# Patient Record
Sex: Female | Born: 1965 | Race: Black or African American | Hispanic: No | Marital: Married | Smoking: Never smoker
Health system: Southern US, Community
[De-identification: ages and names within clinical notes are randomized; demographics above are authoritative.]

## PROBLEM LIST (undated history)

## (undated) DIAGNOSIS — E785 Hyperlipidemia, unspecified: Secondary | ICD-10-CM

## (undated) DIAGNOSIS — Z86718 Personal history of other venous thrombosis and embolism: Secondary | ICD-10-CM

## (undated) DIAGNOSIS — I10 Essential (primary) hypertension: Secondary | ICD-10-CM

## (undated) HISTORY — PX: BREAST SURGERY: SHX581

## (undated) HISTORY — PX: PARTIAL HYSTERECTOMY: SHX80

---

## 2006-12-14 ENCOUNTER — Inpatient Hospital Stay (HOSPITAL_COMMUNITY): Admission: AD | Admit: 2006-12-14 | Discharge: 2006-12-14 | Payer: Self-pay | Admitting: Family Medicine

## 2007-04-02 ENCOUNTER — Other Ambulatory Visit: Admission: RE | Admit: 2007-04-02 | Discharge: 2007-04-02 | Payer: Self-pay | Admitting: Internal Medicine

## 2008-04-17 IMAGING — US US TRANSVAGINAL NON-OB
1 series · 14 of 25 positions shown · non-contrast
Comparison: none

CLINICAL DATA: 41-year-old female with heavy vaginal bleeding.  
 TRANSABDOMINAL AND TRANSVAGINAL PELVIC ULTRASOUND:
TECHNIQUE: Both transabdominal and transvaginal ultrasound examinations of the pelvis were performed including evaluation of the uterus, ovaries, adnexal regions, and pelvic cul-de-sac.

[Series 1: us transvaginal non-ob · 14 of 35 slices shown]
[im 1/35]
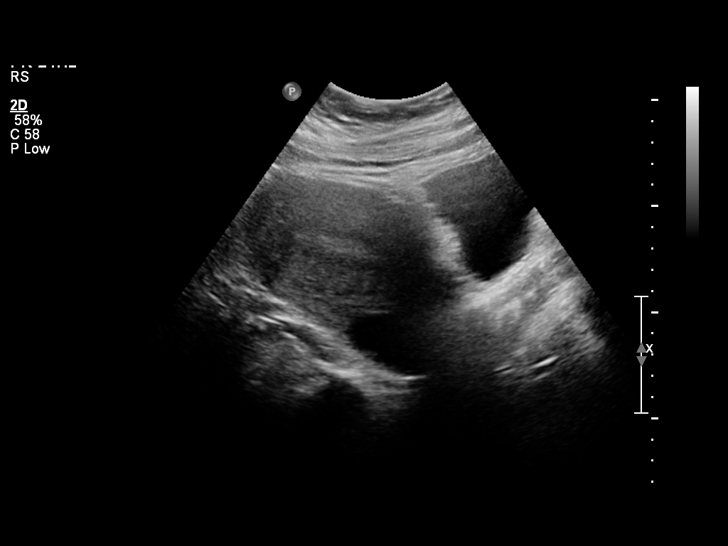
[im 3/35]
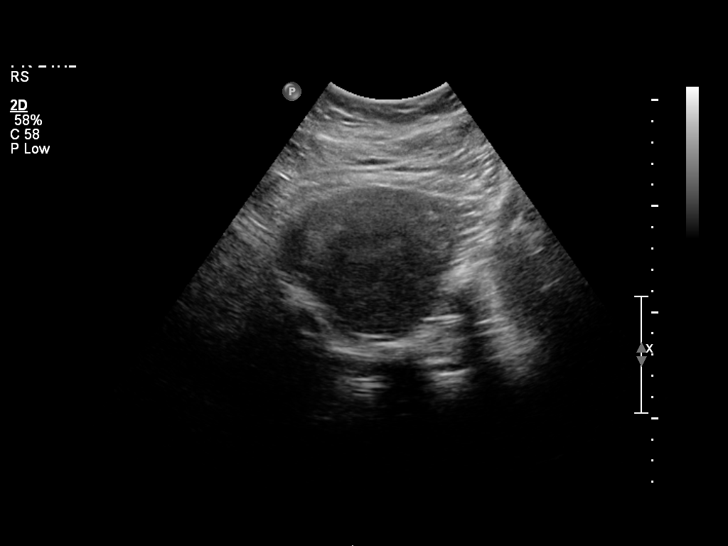
[im 6/35]
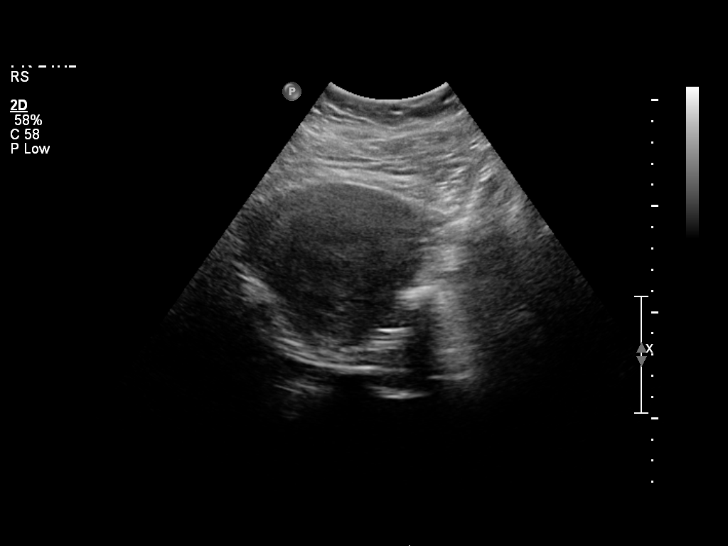
[im 9/35]
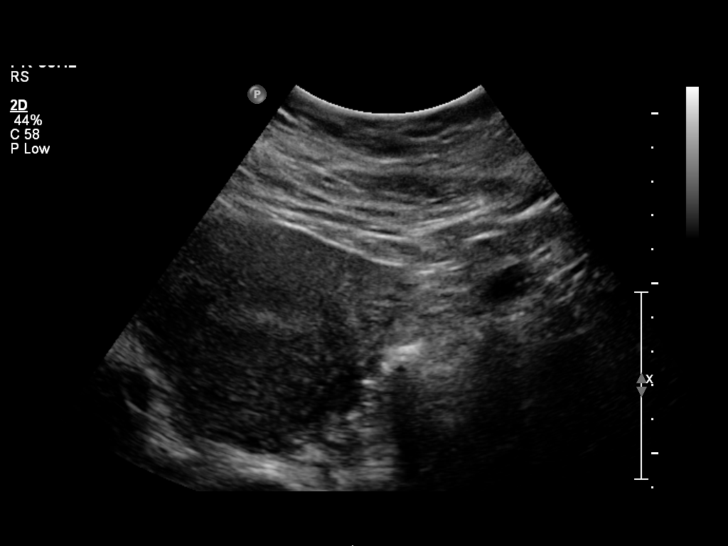
[im 12/35]
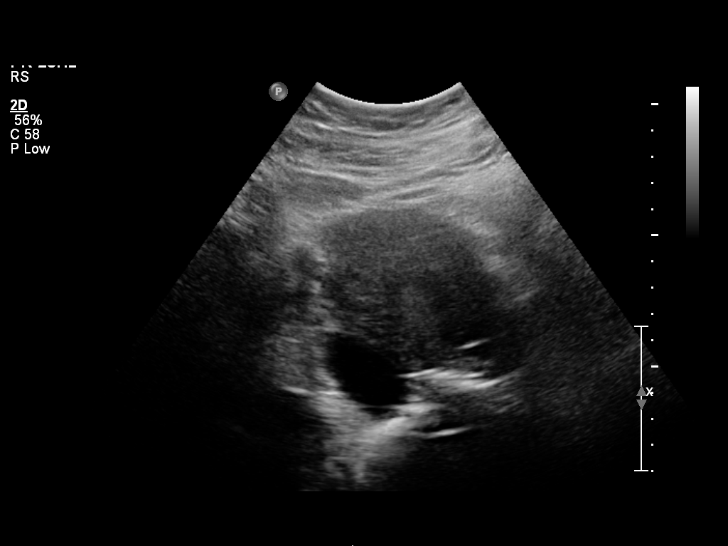
[im 13/35]
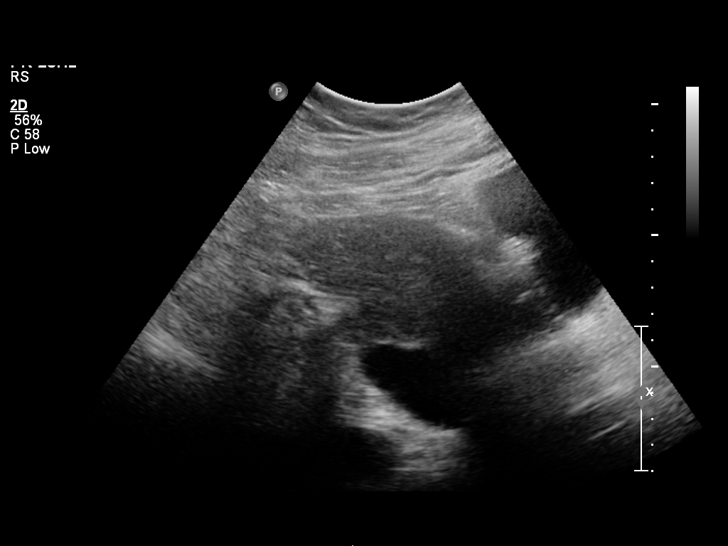
[im 16/35]
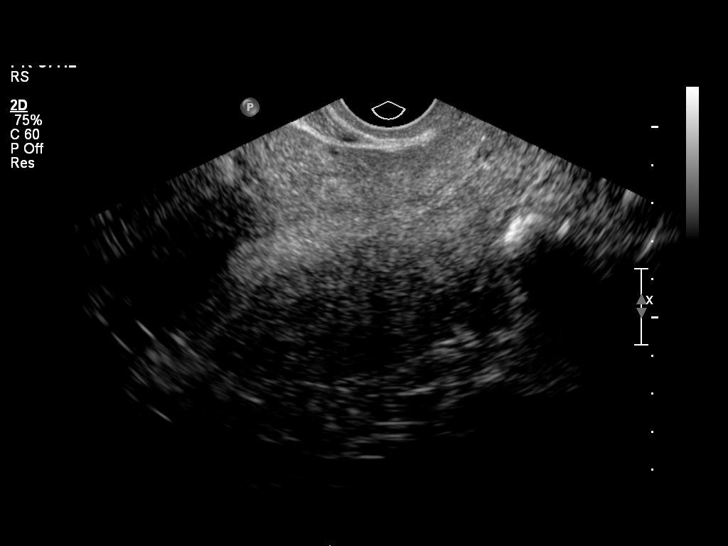
[im 19/35]
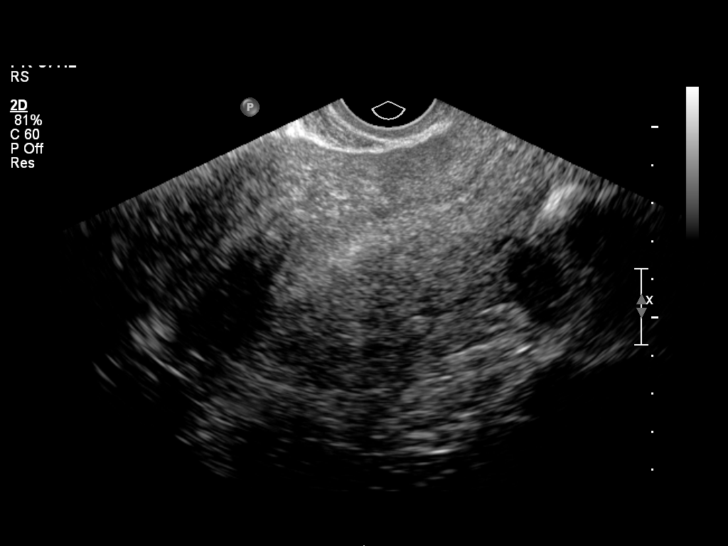
[im 22/35]
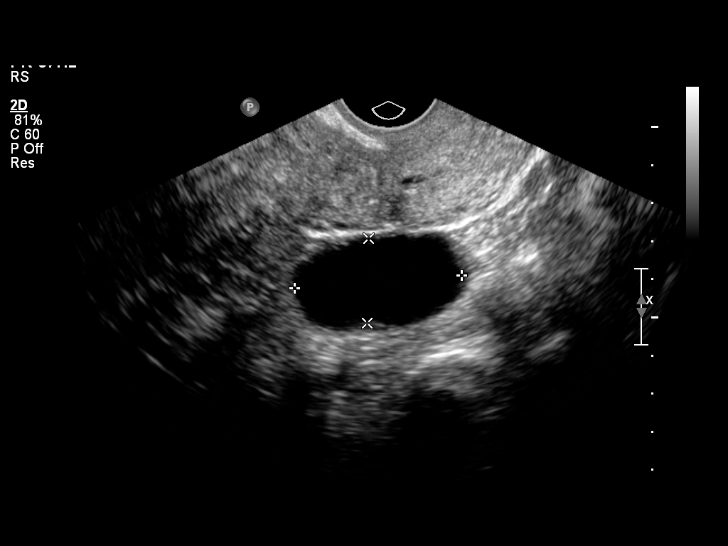
[im 23/35]
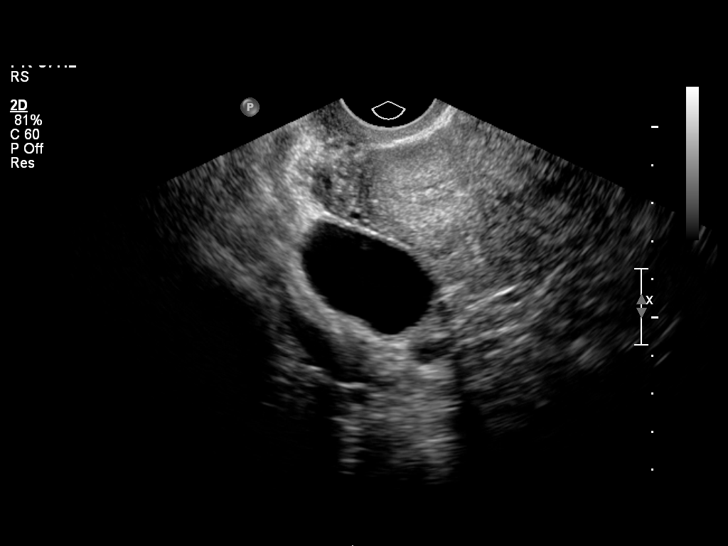
[im 26/35]
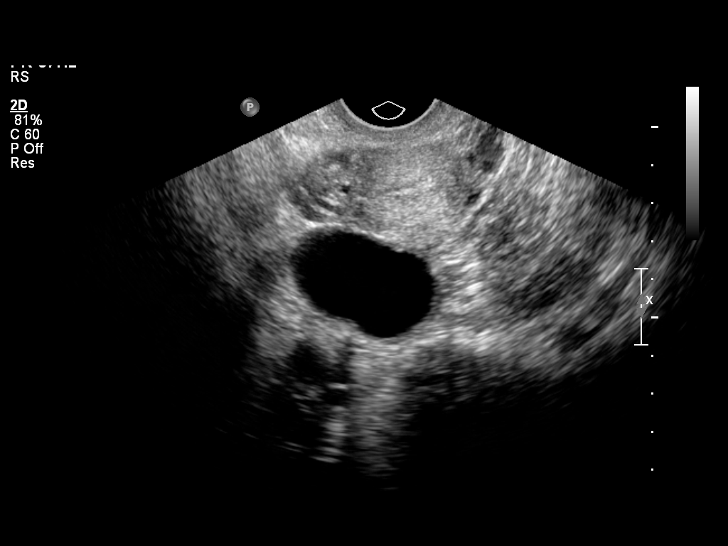
[im 29/35]
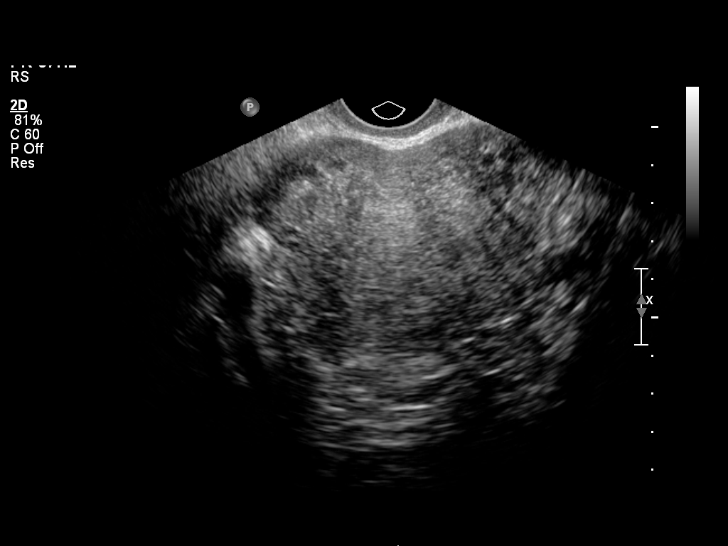
[im 32/35]
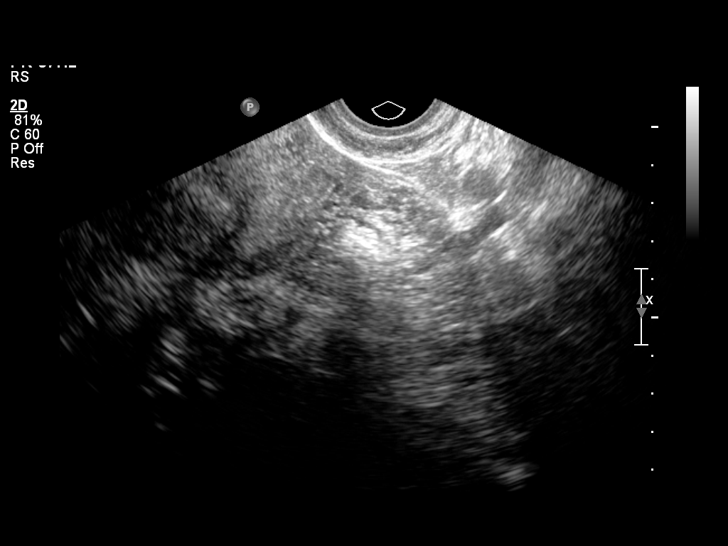
[im 35/35]
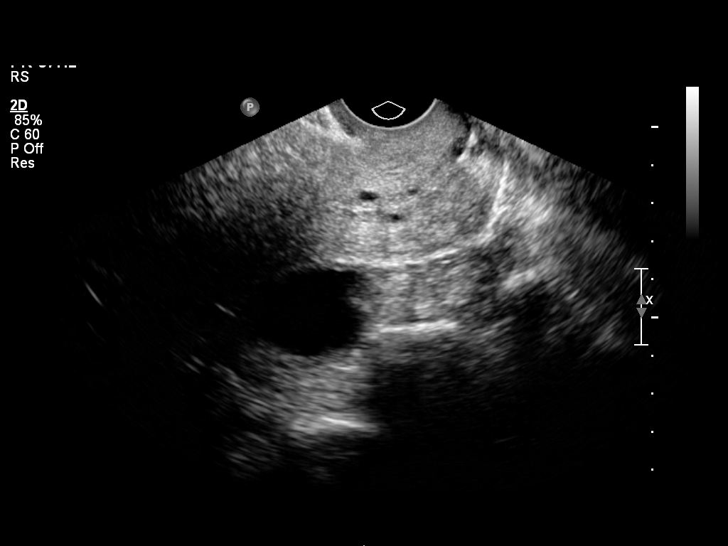

[14 of 25 positions shown; findings below may reference images not displayed]

FINDINGS: The uterus is unremarkable measuring 13 x 6 x 8 cm.  The endometrial stripe is homogeneous measuring 10 mm in greatest diameter.  No focal uterine lesions are identified.  4.4 x 2.2 x 2.9 cm simple appearing cyst within the right ovary is noted.  Remainder of the right ovary is unremarkable.   Left ovary is normal in size and appearance.  No free fluid or solid adnexal mass is identified.
IMPRESSION: 1.  4.4 x 2.2 x 2.9 cm simple right ovarian cyst ? consider ultrasound follow-up in 6-8 weeks.
 2.  No other significant abnormality.

## 2008-12-26 ENCOUNTER — Emergency Department (HOSPITAL_COMMUNITY): Admission: EM | Admit: 2008-12-26 | Discharge: 2008-12-26 | Payer: Self-pay | Admitting: Emergency Medicine

## 2010-03-19 ENCOUNTER — Emergency Department (HOSPITAL_COMMUNITY): Admission: EM | Admit: 2010-03-19 | Discharge: 2010-03-19 | Payer: Self-pay | Admitting: Emergency Medicine

## 2010-04-30 IMAGING — CR DG CHEST 2V
2 series · 2 of 2 positions shown · non-contrast
Comparison: None

CLINICAL DATA: Fever, chills, body aches, productive cough,
hypertension

CHEST - 2 VIEW

[w chest pa]
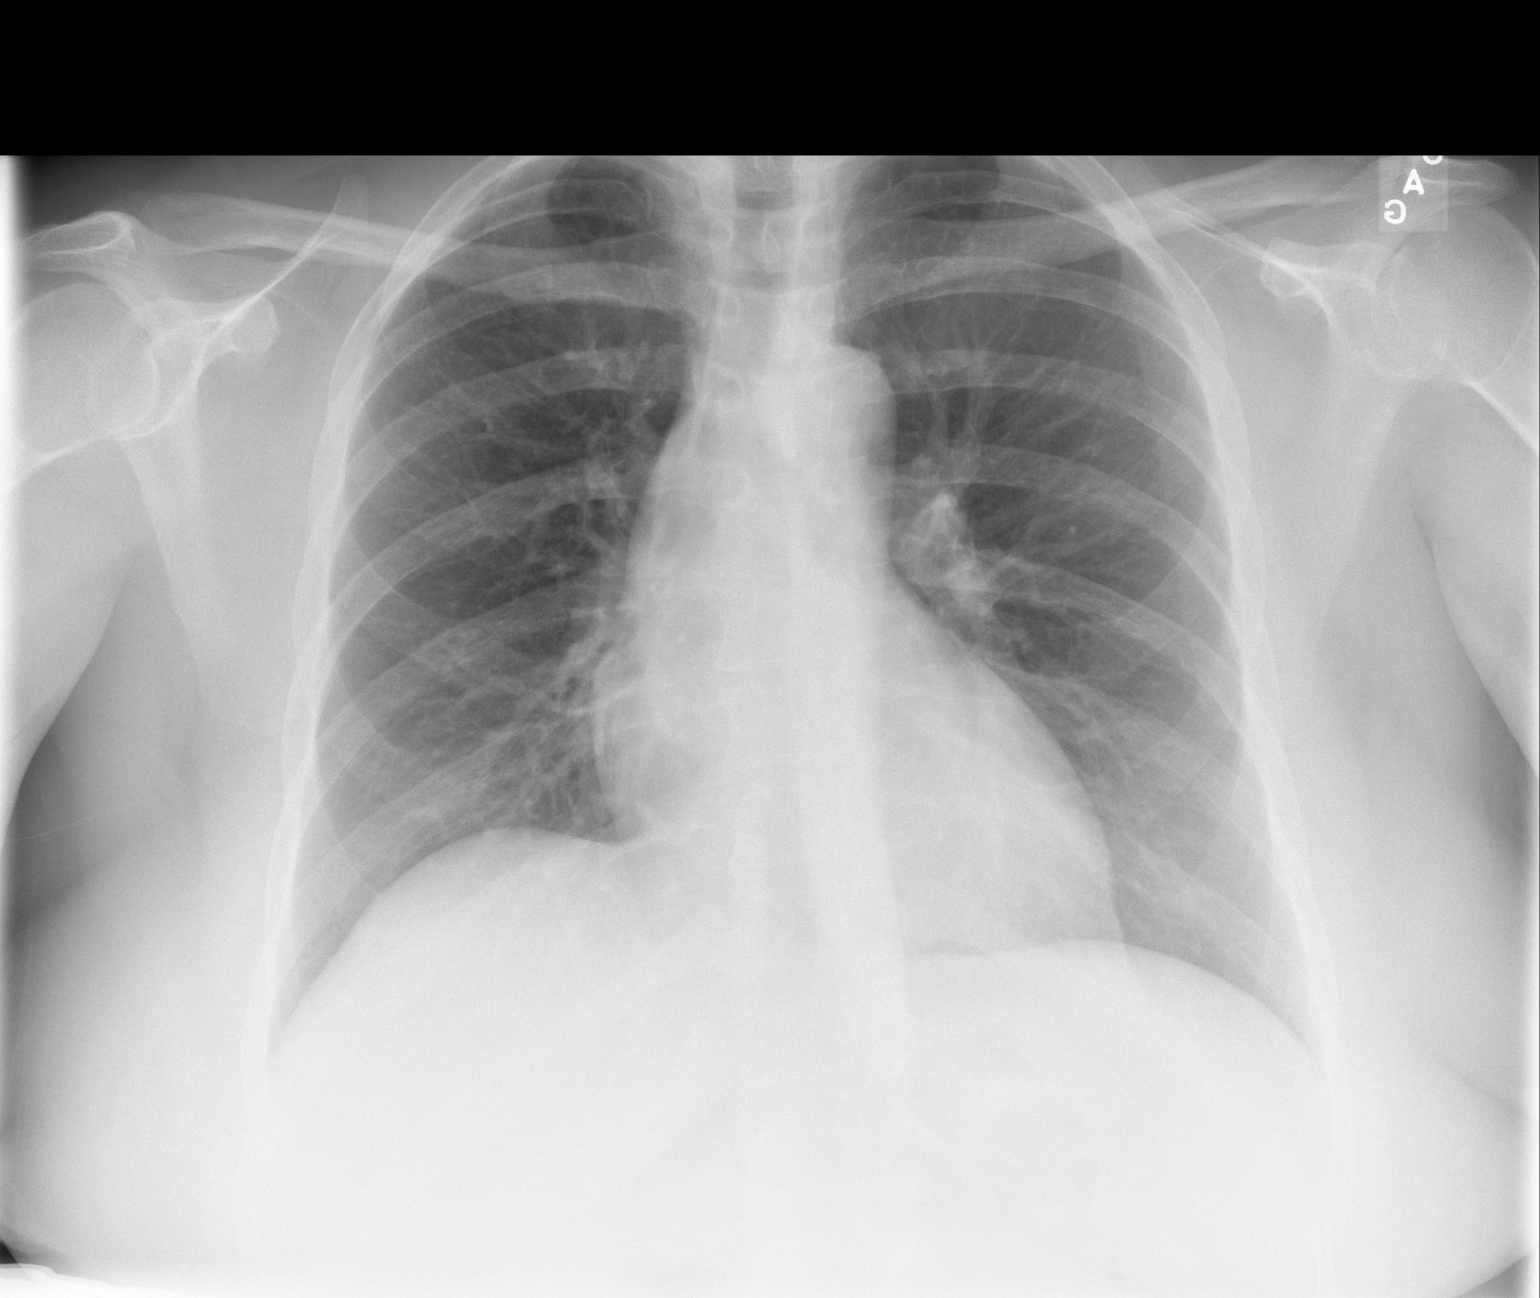

[w chest lat]
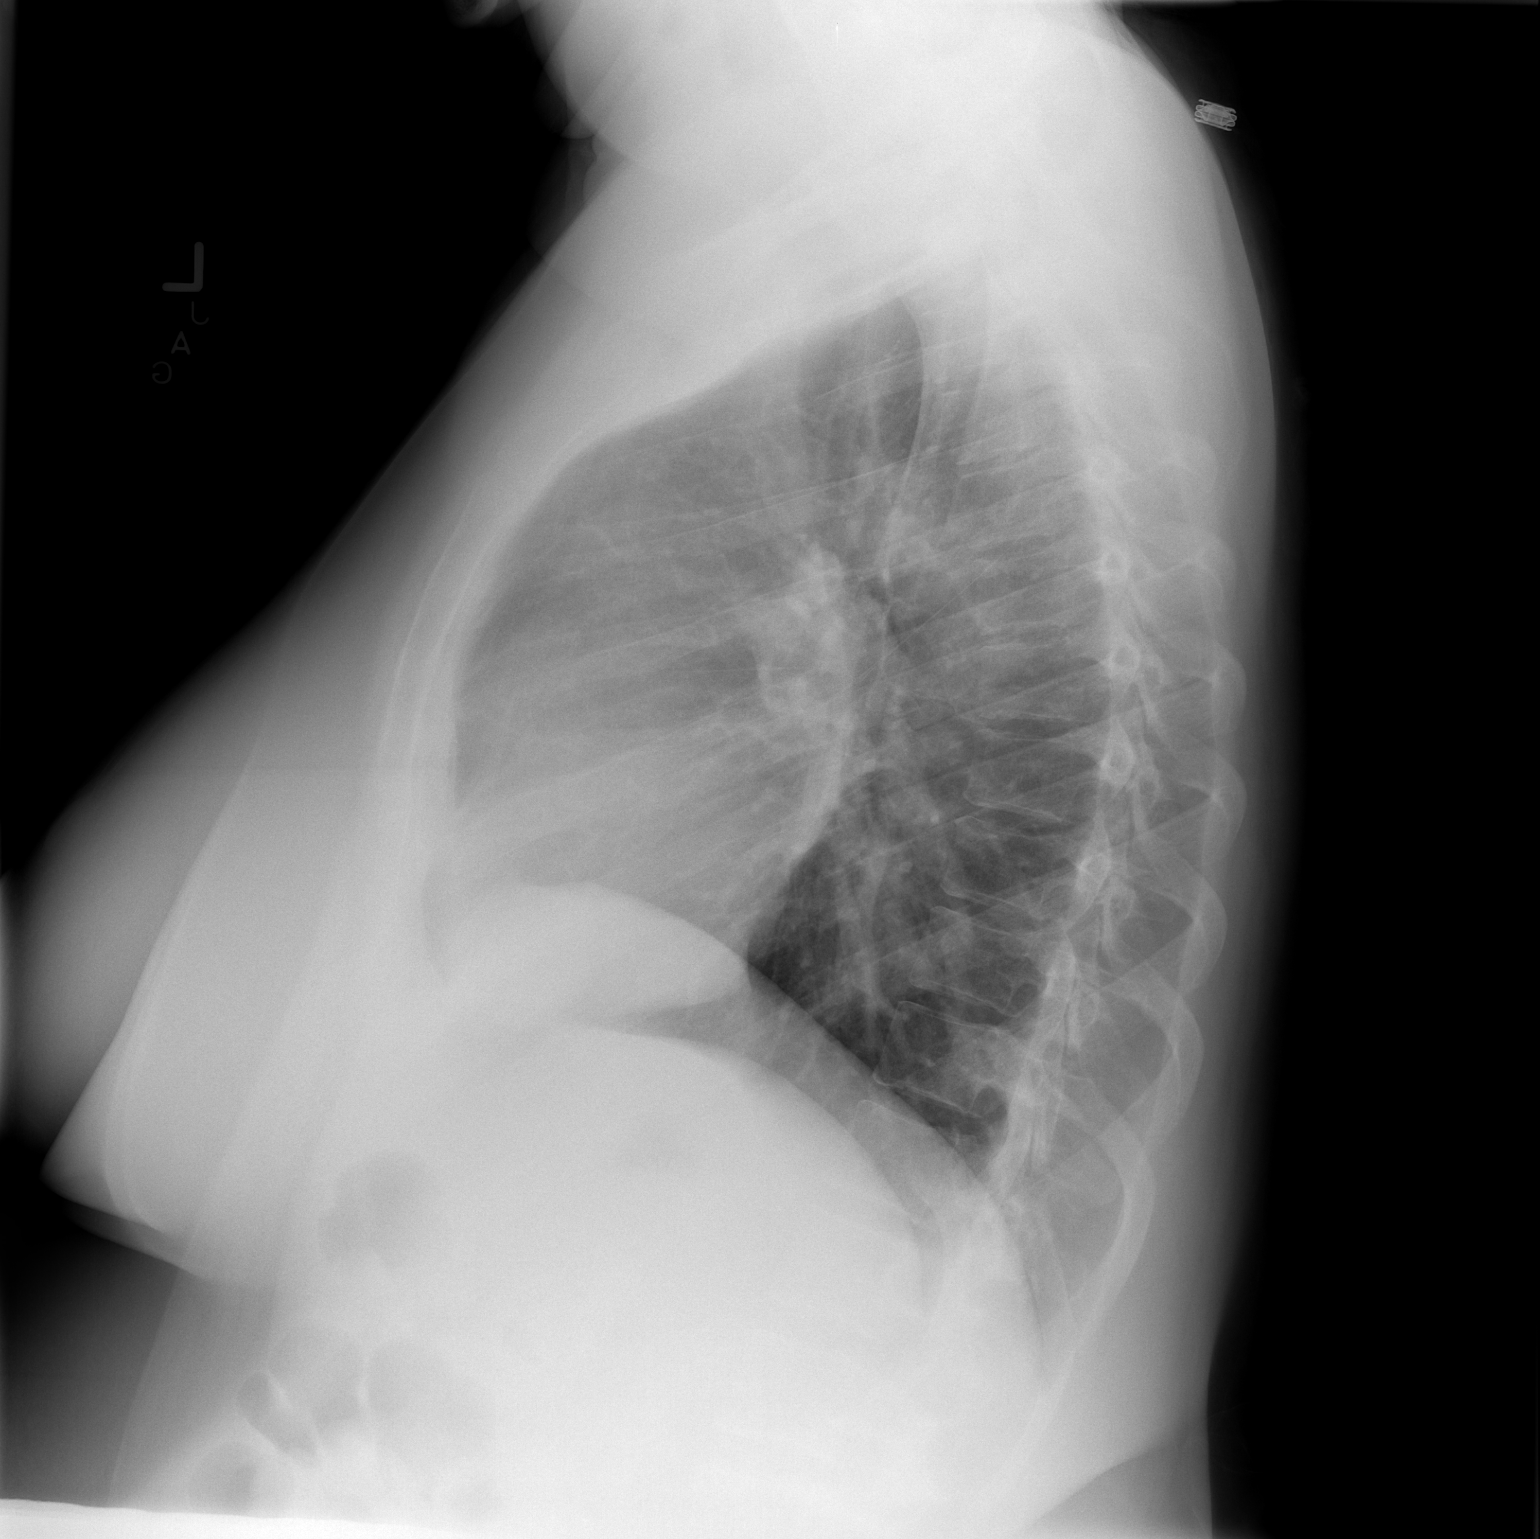

[2 of 2 positions shown; findings below may reference images not displayed]

FINDINGS: Normal heart size, mediastinal contours, and pulmonary vascularity.
Lungs clear.
Bones unremarkable.
No pneumothorax.
IMPRESSION: No acute abnormalities.

## 2010-10-19 LAB — POCT I-STAT, CHEM 8
BUN: 14 mg/dL (ref 6–23)
Calcium, Ion: 1.16 mmol/L (ref 1.12–1.32)
Chloride: 109 mEq/L (ref 96–112)
Creatinine, Ser: 1.3 mg/dL — ABNORMAL HIGH (ref 0.4–1.2)
Glucose, Bld: 99 mg/dL (ref 70–99)
TCO2: 25 mmol/L (ref 0–100)

## 2011-07-27 ENCOUNTER — Ambulatory Visit: Payer: BC Managed Care – PPO

## 2011-07-27 DIAGNOSIS — G43009 Migraine without aura, not intractable, without status migrainosus: Secondary | ICD-10-CM

## 2011-07-27 DIAGNOSIS — I10 Essential (primary) hypertension: Secondary | ICD-10-CM

## 2013-10-21 ENCOUNTER — Institutional Professional Consult (permissible substitution): Payer: Self-pay | Admitting: Internal Medicine

## 2020-04-05 ENCOUNTER — Other Ambulatory Visit: Payer: BLUE CROSS/BLUE SHIELD

## 2020-04-05 ENCOUNTER — Other Ambulatory Visit: Payer: Self-pay

## 2020-04-05 DIAGNOSIS — Z20822 Contact with and (suspected) exposure to covid-19: Secondary | ICD-10-CM

## 2020-04-07 LAB — NOVEL CORONAVIRUS, NAA: SARS-CoV-2, NAA: NOT DETECTED

## 2020-07-05 ENCOUNTER — Ambulatory Visit (HOSPITAL_COMMUNITY)
Admission: EM | Admit: 2020-07-05 | Discharge: 2020-07-05 | Disposition: A | Discharge status: Home / Self Care | Payer: BLUE CROSS/BLUE SHIELD | Attending: Family Medicine | Admitting: Family Medicine

## 2020-07-05 ENCOUNTER — Encounter (HOSPITAL_COMMUNITY): Payer: Self-pay | Admitting: Emergency Medicine

## 2020-07-05 ENCOUNTER — Other Ambulatory Visit: Payer: Self-pay

## 2020-07-05 DIAGNOSIS — Z20822 Contact with and (suspected) exposure to covid-19: Secondary | ICD-10-CM | POA: Insufficient documentation

## 2020-07-05 DIAGNOSIS — K529 Noninfective gastroenteritis and colitis, unspecified: Secondary | ICD-10-CM | POA: Diagnosis not present

## 2020-07-05 HISTORY — DX: Personal history of other venous thrombosis and embolism: Z86.718

## 2020-07-05 HISTORY — DX: Hyperlipidemia, unspecified: E78.5

## 2020-07-05 HISTORY — DX: Essential (primary) hypertension: I10

## 2020-07-05 LAB — SARS CORONAVIRUS 2 (TAT 6-24 HRS): SARS Coronavirus 2: NEGATIVE

## 2020-07-05 MED ORDER — ONDANSETRON 4 MG PO TBDP
4.0000 mg | ORAL_TABLET | Freq: Once | ORAL | Status: AC
  Administered 2020-07-05: 4 mg via ORAL

## 2020-07-05 MED ORDER — ONDANSETRON 4 MG PO TBDP
ORAL_TABLET | ORAL | Status: AC
  Filled 2020-07-05: qty 1

## 2020-07-05 MED ORDER — ONDANSETRON 4 MG PO TBDP: 4.0000 mg | ORAL_TABLET | Freq: Three times a day (TID) | ORAL | 0 refills | Status: DC | PRN

## 2020-07-05 NOTE — ED Triage Notes (Signed)
Pt presents with nausea, vomiting, and diarrhea, headache, chills xs 3-4 days. States coworker has same symptoms. States has not vomited since yesterday afternoon.

## 2020-07-05 NOTE — ED Provider Notes (Signed)
[facility]    CSN: [id number] Arrival date & time: 07/05/20  1336      History   Chief Complaint Chief Complaint  Patient presents with  . Emesis  . Nausea  . Diarrhea  . Chills    HPI Lorenzo Pereyra is a 54 y.o. female.   Lucille Passy presents with complaints of nausea, vomiting, diarrhea, chills, occasional cough. Started two days ago. Feels improved today but hasn't eaten today. Small sips of fluids this afternoon only. No vomiting or diarrhea today. Last was yesterday. No abdominal pain. No fevers. No dizziness. A coworker she rides with has had similar illness. She has had two covid vaccines. Normal urination.    ROS per HPI, negative if not otherwise mentioned.      Past Medical History:  Diagnosis Date  . Hx of blood clots   . Hyperlipemia   . Hypertension     There are no problems to display for this patient.   Past Surgical History:  Procedure Laterality Date  . BREAST SURGERY    . PARTIAL HYSTERECTOMY      OB History   No obstetric history on file.      Home Medications    Prior to Admission medications   Medication Sig Start Date End Date Taking? Authorizing Provider  amLODipine (NORVASC) 5 MG tablet Take 5 mg by mouth daily. 05/21/20   [provider]  atorvastatin (LIPITOR) 20 MG tablet Take 20 mg by mouth daily. 04/25/20   [provider]  Cholecalciferol 125 MCG (5000 UT) capsule Take by mouth.    [provider]  losartan (COZAAR) 100 MG tablet Take 100 mg by mouth daily. 05/07/20   [provider]  ondansetron (ZOFRAN-ODT) 4 MG disintegrating tablet Take 1 tablet (4 mg total) by mouth every 8 (eight) hours as needed for nausea or vomiting. 07/05/20   Serenitee Fuertes, Barron Alvine, NP  XARELTO 20 MG TABS tablet Take 20 mg by mouth daily. 07/05/20   [provider]    Family History Family History  Problem Relation Age of Onset  . Hypertension Mother   . Diabetes Mother   . Heart failure  Mother   . Diabetes Father   . Hypertension Father     Social History Social History   Tobacco Use  . Smoking status: Never Smoker  . Smokeless tobacco: Never Used  Substance Use Topics  . Alcohol use: Not Currently  . Drug use: Not on file     Allergies   Meloxicam, Clonidine derivatives, Lisinopril, and Metoprolol   Review of Systems Review of Systems   Physical Exam Triage Vital Signs ED Triage Vitals  Enc Vitals Group     BP 07/05/20 1512 132/79     Pulse Rate 07/05/20 1512 61     Resp 07/05/20 1512 18     Temp 07/05/20 1512 97.9 F (36.6 C)     Temp Source 07/05/20 1512 Oral     SpO2 07/05/20 1512 100 %     Weight --      Height --      Head Circumference --      Peak Flow --      Pain Score 07/05/20 1508 9     Pain Loc --      Pain Edu? --      Excl. in GC? --    No data found.  Updated Vital Signs BP 132/79 (BP Location: Right Arm)   Pulse 61   Temp  97.9 F (36.6 C) (Oral)   Resp 18   SpO2 100%   Visual Acuity Right Eye Distance:   Left Eye Distance:   Bilateral Distance:    Right Eye Near:   Left Eye Near:    Bilateral Near:     Physical Exam Constitutional:      General: She is not in acute distress.    Appearance: She is well-developed.  Cardiovascular:     Rate and Rhythm: Normal rate.  Pulmonary:     Effort: Pulmonary effort is normal.  Abdominal:     Tenderness: There is no abdominal tenderness.  Skin:    General: Skin is warm and dry.  Neurological:     Mental Status: She is alert and oriented to person, place, and time.      UC Treatments / Results  Labs (all labs ordered are listed, but only abnormal results are displayed) Labs Reviewed  SARS CORONAVIRUS 2 (TAT 6-24 HRS)    EKG   Radiology No results found.  Procedures Procedures (including critical care time)  Medications Ordered in UC Medications  ondansetron (ZOFRAN-ODT) disintegrating tablet 4 mg (has no administration in time range)    Initial  Impression / Assessment and Plan / UC Course  I have reviewed the triage vital signs and the nursing notes.  Pertinent labs & imaging results that were available during my care of the patient were reviewed by me and considered in my medical decision making (see chart for details).     Non toxic. No abdominal pain. No fevers. Taking some sips of fluids while here in clinic. History and physical consistent with viral illness, likely from coworker. Covid testing pending and isolation instructions provided.  Supportive cares recommended. Return precautions provided. Patient verbalized understanding and agreeable to plan.   Final Clinical Impressions(s) / UC Diagnoses   Final diagnoses:  Gastroenteritis     Discharge Instructions     Zofran every 8 hours as needed for nausea or vomiting.  Small frequent sips of fluids- Pedialyte, Gatorade, water, broth- to maintain hydration.   Isolate until your covid testing is back, should result in the next 24 hours. We will call you if positive, your results will post to your MyChart.  If symptoms worsen or do not improve in the next week to return to be seen or to follow up with your PCP.      ED Prescriptions    Medication Sig Dispense Auth. Provider   ondansetron (ZOFRAN-ODT) 4 MG disintegrating tablet Take 1 tablet (4 mg total) by mouth every 8 (eight) hours as needed for nausea or vomiting. 12 tablet Georgetta Haber, NP     PDMP not reviewed this encounter.   Georgetta Haber, NP 07/05/20 1550

## 2020-07-05 NOTE — Discharge Instructions (Addendum)
Zofran every 8 hours as needed for nausea or vomiting.  Small frequent sips of fluids- Pedialyte, Gatorade, water, broth- to maintain hydration.   Isolate until your covid testing is back, should result in the next 24 hours. We will call you if positive, your results will post to your MyChart.  If symptoms worsen or do not improve in the next week to return to be seen or to follow up with your PCP.

## 2022-02-20 ENCOUNTER — Ambulatory Visit (HOSPITAL_COMMUNITY)
Admission: EM | Admit: 2022-02-20 | Discharge: 2022-02-20 | Disposition: A | Discharge status: Home / Self Care | Payer: BC Managed Care – PPO | Attending: Family Medicine | Admitting: Family Medicine

## 2022-02-20 ENCOUNTER — Encounter (HOSPITAL_COMMUNITY): Payer: Self-pay | Admitting: Emergency Medicine

## 2022-02-20 ENCOUNTER — Ambulatory Visit (INDEPENDENT_AMBULATORY_CARE_PROVIDER_SITE_OTHER): Payer: BC Managed Care – PPO

## 2022-02-20 DIAGNOSIS — N3090 Cystitis, unspecified without hematuria: Secondary | ICD-10-CM | POA: Diagnosis present

## 2022-02-20 DIAGNOSIS — R053 Chronic cough: Secondary | ICD-10-CM | POA: Diagnosis not present

## 2022-02-20 DIAGNOSIS — R059 Cough, unspecified: Secondary | ICD-10-CM | POA: Diagnosis not present

## 2022-02-20 LAB — POCT URINALYSIS DIPSTICK, ED / UC
Bilirubin Urine: NEGATIVE
Glucose, UA: NEGATIVE mg/dL
Ketones, ur: NEGATIVE mg/dL
Leukocytes,Ua: NEGATIVE
Nitrite: NEGATIVE
Protein, ur: NEGATIVE mg/dL
Specific Gravity, Urine: 1.015 (ref 1.005–1.030)
Urobilinogen, UA: 0.2 mg/dL (ref 0.0–1.0)
pH: 6.5 (ref 5.0–8.0)

## 2022-02-20 MED ORDER — CEPHALEXIN 500 MG PO CAPS: 500.0000 mg | ORAL_CAPSULE | Freq: Two times a day (BID) | ORAL | 0 refills | Status: DC

## 2022-02-20 NOTE — ED Triage Notes (Signed)
Patient c/o cloudy and odorus urine x 2 weeks, no hematuria.  Patient denies any STI due to not being sexually active.  Patient denies any OTC meds.  Patient is also having a cough.

## 2022-02-20 NOTE — Discharge Instructions (Addendum)
You have had labs (urine culture) sent today. We will call you with any significant abnormalities or if there is need to begin or change treatment or pursue further follow up.  You may also review your test results online through MyChart. If you do not have a MyChart account, instructions to sign up should be on your discharge paperwork.  

## 2022-02-21 NOTE — ED Provider Notes (Signed)
[facility]    ASSESSMENT & PLAN:  1. Cystitis   2. Persistent cough for 3 weeks or longer    I have personally viewed the imaging studies ordered this visit. No acute changes on CXR. Cough likely post-viral. Prefers OTC tx as needed.  Begin: Meds ordered this encounter  Medications   cephALEXin (KEFLEX) 500 MG capsule    Sig: Take 1 capsule (500 mg total) by mouth 2 (two) times daily.    Dispense:  10 capsule    Refill:  0   No signs of pyelonephritis. Discussed. Urine culture sent. Will notify patient of any significant results. Will follow up with her PCP or here if not showing improvement over the next 48 hours, sooner if needed.  Outlined signs and symptoms indicating need for more acute intervention. Patient verbalized understanding. After Visit Summary given.  SUBJECTIVE:  Beth Houston is a 56 y.o. female who complains of urinary frequency, urgency and dysuria for the past 2 w. Without associated flank pain, fever, chills, vaginal discharge or bleeding. Gross hematuria: not present. No specific aggravating or alleviating factors reported. No LE edema. Normal PO intake without n/v/d. Without specific abdominal pain. Ambulatory without difficulty. No tx PTA.  LMP: No LMP recorded. Patient has had a hysterectomy.  Also reports dry cough; x 3 weeks after URI. No SOB/wheezing.  OBJECTIVE:  Vitals:   02/20/22 1534 02/20/22 1536  BP: 115/75   Pulse: 71   Resp: 18   Temp: 98.3 F (36.8 C)   TempSrc: Oral   SpO2: 95%   Weight:  136.1 kg  Height:  5' 4.5" (1.638 m)   General appearance: alert; no distress HENT: oropharynx: moist Lungs: unlabored respirations; CTAB Abdomen: soft, non-tender; bowel sounds normal; no masses or organomegaly; no guarding or rebound tenderness Back: no CVA tenderness Extremities: no edema; symmetrical with no gross deformities Skin: warm and dry Neurologic: normal gait Psychological: alert and cooperative; normal  mood and affect  Labs Reviewed  POCT URINALYSIS DIPSTICK, ED / UC - Abnormal; Notable for the following components:      Result Value   Hgb urine dipstick TRACE (*)    All other components within normal limits  URINE CULTURE    Allergies  Allergen Reactions   Meloxicam Palpitations    Ibuprofen is fine   Clonidine Derivatives Other (See Comments)    "passing out"   Lisinopril Other (See Comments)    Lightheadedness, fatigue   Metoprolol Other (See Comments)    "slows heart rate down"    Past Medical History:  Diagnosis Date   Hx of blood clots    Hyperlipemia    Hypertension    Social History   Socioeconomic History   Marital status: Married    Spouse name: Not on file   Number of children: Not on file   Years of education: Not on file   Highest education level: Not on file  Occupational History   Not on file  Tobacco Use   Smoking status: Never   Smokeless tobacco: Never  Substance and Sexual Activity   Alcohol use: Never   Drug use: Never   Sexual activity: Not Currently  Other Topics Concern   Not on file  Social History Narrative   Not on file   Social Determinants of Health   Financial Resource Strain: Not on file  Food Insecurity: Not on file  Transportation Needs: Not on file  Physical Activity: Not on file  Stress: Not on file  Social Connections: Not on file  Intimate Partner Violence: Not on file   Family History  Problem Relation Age of Onset   Hypertension Mother    Diabetes Mother    Heart failure Mother    Diabetes Father    Hypertension Father         Mardella Layman, MD 02/21/22 1331

## 2022-02-22 LAB — URINE CULTURE: Culture: >=100000 — AB

## 2022-06-17 ENCOUNTER — Ambulatory Visit (HOSPITAL_COMMUNITY)
Admission: EM | Admit: 2022-06-17 | Discharge: 2022-06-17 | Disposition: A | Discharge status: Home / Self Care | Payer: BC Managed Care – PPO | Attending: Emergency Medicine | Admitting: Emergency Medicine

## 2022-06-17 ENCOUNTER — Encounter (HOSPITAL_COMMUNITY): Payer: Self-pay

## 2022-06-17 DIAGNOSIS — R0600 Dyspnea, unspecified: Secondary | ICD-10-CM

## 2022-06-17 DIAGNOSIS — Z889 Allergy status to unspecified drugs, medicaments and biological substances status: Secondary | ICD-10-CM

## 2022-06-17 DIAGNOSIS — R051 Acute cough: Secondary | ICD-10-CM

## 2022-06-17 MED ORDER — ALBUTEROL SULFATE (2.5 MG/3ML) 0.083% IN NEBU
INHALATION_SOLUTION | RESPIRATORY_TRACT | Status: AC
  Filled 2022-06-17: qty 3

## 2022-06-17 MED ORDER — LEVOCETIRIZINE DIHYDROCHLORIDE 5 MG PO TABS
5.0000 mg | ORAL_TABLET | Freq: Every evening | ORAL | 0 refills | Status: DC
Start: 2022-06-17 — End: 2023-06-13

## 2022-06-17 MED ORDER — ALBUTEROL SULFATE HFA 108 (90 BASE) MCG/ACT IN AERS: 2.0000 | INHALATION_SPRAY | Freq: Four times a day (QID) | RESPIRATORY_TRACT | 0 refills | Status: AC | PRN

## 2022-06-17 MED ORDER — PROMETHAZINE-DM 6.25-15 MG/5ML PO SYRP: 5.0000 mL | ORAL_SOLUTION | Freq: Every evening | ORAL | 0 refills | Status: DC

## 2022-06-17 MED ORDER — ALBUTEROL SULFATE (2.5 MG/3ML) 0.083% IN NEBU
2.5000 mg | INHALATION_SOLUTION | Freq: Once | RESPIRATORY_TRACT | Status: AC
  Administered 2022-06-17: 2.5 mg via RESPIRATORY_TRACT

## 2022-06-17 MED ORDER — PREDNISONE 20 MG PO TABS: 40.0000 mg | ORAL_TABLET | Freq: Every day | ORAL | 0 refills | Status: DC

## 2022-06-17 NOTE — ED Triage Notes (Signed)
Pt is here for a cough, runny nose, , pt states she has been coughing to the point it hurts her stomach and throat x2 days

## 2022-06-17 NOTE — Discharge Instructions (Addendum)
Prednisone has been sent to the pharmacy, you will take 2 tablets each morning for the next 5 days.   Levocetirizine has been sent to the pharmacy, please start taking 1 tablet every evening.   Promethazine DM has been sent to the pharmacy, you can take this medication at night before bedtime, medication can make you feel sleepy, please do not operate any heavy machinery or drive a car after taking this medication.   Albuterol has been sent to the pharmacy, use this medication over the next 2 days, take 2 puffs every 6 hours while you are awake.   Please begin taking the trellegy inhaler that has been prescribed by your Pulmonologist, take this 1 time daily.   Please continue to follow-up with the pulmonologist.

## 2022-06-17 NOTE — ED Provider Notes (Incomplete)
[facility]    CSN: [id number] Arrival date & time: 06/17/22  1740      History   Chief Complaint Chief Complaint  Patient presents with   Cough    HPI Beth Houston is a 56 y.o. female.  Patient presents due to productive cough x 2-3 days.  Patient reports episodes of wheezing upon onset of symptoms.  Patient reports rhinorrhea at times.  Patient reports cough worsens at night and causes difficulty with sleeping.  Patient reports shortness of breath when coughing.  Patient reports that she began having similar symptoms in May of this year, she has seen a pulmonologist and will have another appointment on November 29 th.  Patient reports that the pulmonologist ordered a test for allergies.  Patient reports that the symptoms flareup when the weather changes.  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  Patient is currently taking Xarelto.    Cough Associated symptoms: rhinorrhea, shortness of breath and wheezing     Past Medical History:  Diagnosis Date   Hx of blood clots    Hyperlipemia    Hypertension     There are no problems to display for this patient.   Past Surgical History:  Procedure Laterality Date   BREAST SURGERY     PARTIAL HYSTERECTOMY      OB History   No obstetric history on file.      Home Medications    Prior to Admission medications   Medication Sig Start Date End Date Taking? Authorizing Provider  amLODipine (NORVASC) 5 MG tablet Take 5 mg by mouth daily. 05/21/20   [provider]  atorvastatin (LIPITOR) 20 MG tablet Take 20 mg by mouth daily. 04/25/20   [provider]  cephALEXin (KEFLEX) 500 MG capsule Take 1 capsule (500 mg total) by mouth 2 (two) times daily. 02/20/22   Mardella Layman, MD  Cholecalciferol 125 MCG (5000 UT) capsule Take by mouth.    [provider]  hydrochlorothiazide (HYDRODIURIL) 25 MG tablet Take 25 mg by mouth daily. 01/02/22   [provider]   losartan (COZAAR) 100 MG tablet Take 100 mg by mouth daily. 05/07/20   [provider]  ondansetron (ZOFRAN-ODT) 4 MG disintegrating tablet Take 1 tablet (4 mg total) by mouth every 8 (eight) hours as needed for nausea or vomiting. 07/05/20   Burky, Barron Alvine, NP  XARELTO 20 MG TABS tablet Take 20 mg by mouth daily. 07/05/20   [provider]    Family History Family History  Problem Relation Age of Onset   Hypertension Mother    Diabetes Mother    Heart failure Mother    Diabetes Father    Hypertension Father     Social History Social History   Tobacco Use   Smoking status: Never   Smokeless tobacco: Never  Substance Use Topics   Alcohol use: Never   Drug use: Never     Allergies   Meloxicam, Clonidine derivatives, Lisinopril, and Metoprolol   Review of Systems Review of Systems  HENT:  Positive for rhinorrhea.   Respiratory:  Positive for cough, shortness of breath and wheezing. Negative for chest tightness.      Physical Exam Triage Vital Signs ED Triage Vitals  Enc Vitals Group     BP 06/17/22 1914 (!) 161/84     Pulse Rate 06/17/22 1914 87     Resp 06/17/22 1914 18     Temp 06/17/22 1914 98.4 F (36.9  C)     Temp Source 06/17/22 1914 Oral     SpO2 06/17/22 1914 97 %     Weight --      Height --      Head Circumference --      Peak Flow --      Pain Score 06/17/22 1913 0     Pain Loc --      Pain Edu? --      Excl. in GC? --    No data found.  Updated Vital Signs BP (!) 161/84 (BP Location: Right Arm)   Pulse 87   Temp 98.4 F (36.9 C) (Oral)   Resp 18   SpO2 97%     Physical Exam   UC Treatments / Results  Labs (all labs ordered are listed, but only abnormal results are displayed) Labs Reviewed - No data to display  EKG   Radiology No results found.  Procedures Procedures (including critical care time)  Medications Ordered in UC Medications  albuterol (PROVENTIL) (2.5 MG/3ML) 0.083% nebulizer solution 2.5 mg  (has no administration in time range)    Initial Impression / Assessment and Plan / UC Course  I have reviewed the triage vital signs and the nursing notes.  Pertinent labs & imaging results that were available during my care of the patient were reviewed by me and considered in my medical decision making (see chart for details).     *** Final Clinical Impressions(s) / UC Diagnoses   Final diagnoses:  None   Discharge Instructions   None    ED Prescriptions   None    PDMP not reviewed this encounter.

## 2023-06-13 ENCOUNTER — Encounter (HOSPITAL_COMMUNITY): Payer: Self-pay | Admitting: *Deleted

## 2023-06-13 ENCOUNTER — Ambulatory Visit (INDEPENDENT_AMBULATORY_CARE_PROVIDER_SITE_OTHER): Payer: BC Managed Care – PPO

## 2023-06-13 ENCOUNTER — Other Ambulatory Visit: Payer: Self-pay

## 2023-06-13 ENCOUNTER — Ambulatory Visit (HOSPITAL_COMMUNITY)
Admission: EM | Admit: 2023-06-13 | Discharge: 2023-06-13 | Disposition: A | Discharge status: Home / Self Care | Payer: BC Managed Care – PPO | Attending: Nurse Practitioner | Admitting: Nurse Practitioner

## 2023-06-13 DIAGNOSIS — J069 Acute upper respiratory infection, unspecified: Secondary | ICD-10-CM | POA: Diagnosis present

## 2023-06-13 DIAGNOSIS — N3001 Acute cystitis with hematuria: Secondary | ICD-10-CM | POA: Diagnosis present

## 2023-06-13 DIAGNOSIS — R058 Other specified cough: Secondary | ICD-10-CM | POA: Diagnosis not present

## 2023-06-13 DIAGNOSIS — R051 Acute cough: Secondary | ICD-10-CM | POA: Insufficient documentation

## 2023-06-13 LAB — POCT URINALYSIS DIP (MANUAL ENTRY)
Bilirubin, UA: NEGATIVE
Glucose, UA: NEGATIVE mg/dL
Ketones, POC UA: NEGATIVE mg/dL
Nitrite, UA: POSITIVE — AB
Spec Grav, UA: 1.02 (ref 1.010–1.025)
Urobilinogen, UA: 0.2 U/dL
pH, UA: 6 (ref 5.0–8.0)

## 2023-06-13 LAB — POCT RAPID STREP A (OFFICE): Rapid Strep A Screen: NEGATIVE

## 2023-06-13 MED ORDER — AMOXICILLIN-POT CLAVULANATE 875-125 MG PO TABS: 1.0000 | ORAL_TABLET | Freq: Two times a day (BID) | ORAL | 0 refills | Status: AC

## 2023-06-13 MED ORDER — PROMETHAZINE-DM 6.25-15 MG/5ML PO SYRP: 5.0000 mL | ORAL_SOLUTION | Freq: Four times a day (QID) | ORAL | 0 refills | Status: AC | PRN

## 2023-06-13 NOTE — ED Triage Notes (Signed)
Pt reports she has a sore throat for past few days. Pt also has a strong odor to urine and urine is cloudy.

## 2023-06-13 NOTE — ED Provider Notes (Signed)
[facility]    CSN: [id number] Arrival date & time: 06/13/23  1040      History   Chief Complaint Chief Complaint  Patient presents with   Sore Throat   UTI   Cough    HPI Beth Houston is a 57 y.o. female.   HPI  She is in today with complaints of sore throat and cough.  She reports that she has a history of asthma and allergic rhinitis.  She is being followed by pulmonology within the [facility] system.  She reports that she was given benzonatate however this has not been effective.  She will follow-up with the pulmonologist on Monday.  She reports that she has not had a chest x-ray in the last year.  She denies any recent exposures.  She denies any fever, chills, headaches, dizziness, ear pain or pressure, wheezing.  She does have slight discomfort of her left chest area which comes with a cough.  She does have a history significant of blood clots.  She is on Xarelto 20 mg daily. She also reports that she is experiencing cloudy urine with strong odor.  She denies any nausea, vomiting or diarrhea. Past Medical History:  Diagnosis Date   Hx of blood clots    Hyperlipemia    Hypertension     There are no problems to display for this patient.   Past Surgical History:  Procedure Laterality Date   BREAST SURGERY     PARTIAL HYSTERECTOMY      OB History   No obstetric history on file.      Home Medications    Prior to Admission medications   Medication Sig Start Date End Date Taking? Authorizing Provider  albuterol (VENTOLIN HFA) 108 (90 Base) MCG/ACT inhaler Inhale 2 puffs into the lungs every 6 (six) hours as needed for wheezing or shortness of breath. 06/17/22  Yes Debby Freiberg, NP  amLODipine (NORVASC) 5 MG tablet Take 5 mg by mouth daily. 05/21/20  Yes [provider]  amoxicillin-clavulanate (AUGMENTIN) 875-125 MG tablet Take 1 tablet by mouth every 12 (twelve) hours. 06/13/23  Yes Barbette Merino, NP  atorvastatin (LIPITOR) 20 MG  tablet Take 20 mg by mouth daily. 04/25/20  Yes [provider]  hydrochlorothiazide (HYDRODIURIL) 25 MG tablet Take 25 mg by mouth daily. 01/02/22  Yes [provider]  losartan (COZAAR) 100 MG tablet Take 100 mg by mouth daily. 05/07/20  Yes [provider]  promethazine-dextromethorphan (PROMETHAZINE-DM) 6.25-15 MG/5ML syrup Take 5 mLs by mouth 4 (four) times daily as needed for cough. 06/13/23  Yes Barbette Merino, NP  Cholecalciferol 125 MCG (5000 UT) capsule Take by mouth.    [provider]  XARELTO 20 MG TABS tablet Take 20 mg by mouth daily. 07/05/20   [provider]    Family History Family History  Problem Relation Age of Onset   Hypertension Mother    Diabetes Mother    Heart failure Mother    Diabetes Father    Hypertension Father     Social History Social History   Tobacco Use   Smoking status: Never   Smokeless tobacco: Never  Substance Use Topics   Alcohol use: Never   Drug use: Never     Allergies   Meloxicam, Clonidine derivatives, Lisinopril, and Metoprolol   Review of Systems Review of Systems   Physical Exam Triage Vital Signs ED Triage Vitals  Encounter Vitals Group     BP 06/13/23 1157 138/83  Systolic BP Percentile --      Diastolic BP Percentile --      Pulse Rate 06/13/23 1157 68     Resp 06/13/23 1157 20     Temp 06/13/23 1157 98.3 F (36.8 C)     Temp src --      SpO2 06/13/23 1157 94 %     Weight --      Height --      Head Circumference --      Peak Flow --      Pain Score 06/13/23 1154 10     Pain Loc --      Pain Education --      Exclude from Growth Chart --    No data found.  Updated Vital Signs BP 138/83   Pulse 68   Temp 98.3 F (36.8 C)   Resp 20   SpO2 94%   Visual Acuity Right Eye Distance:   Left Eye Distance:   Bilateral Distance:    Right Eye Near:   Left Eye Near:    Bilateral Near:     Physical Exam Constitutional:      General: She is not in acute  distress.    Appearance: She is obese.  HENT:     Head: Atraumatic.     Nose: Rhinorrhea present. No congestion.     Mouth/Throat:     Mouth: Mucous membranes are moist.     Tonsils: No tonsillar exudate or tonsillar abscesses.  Cardiovascular:     Rate and Rhythm: Normal rate.     Heart sounds: Normal heart sounds.  Pulmonary:     Comments: Course" right lower lobe breath sounds diminished breath sounds Musculoskeletal:     Cervical back: Normal range of motion.  Skin:    General: Skin is warm and dry.     Capillary Refill: Capillary refill takes less than 2 seconds.  Neurological:     General: No focal deficit present.     Mental Status: She is alert and oriented to person, place, and time.  Psychiatric:        Mood and Affect: Mood normal.        Behavior: Behavior normal.      UC Treatments / Results  Labs (all labs ordered are listed, but only abnormal results are displayed) Labs Reviewed  POCT URINALYSIS DIP (MANUAL ENTRY) - Abnormal; Notable for the following components:      Result Value   Clarity, UA cloudy (*)    Blood, UA small (*)    Protein Ur, POC trace (*)    Nitrite, UA Positive (*)    Leukocytes, UA Trace (*)    All other components within normal limits  URINE CULTURE  POCT RAPID STREP A (OFFICE)    EKG   Radiology No results found.  Procedures Procedures (including critical care time)  Medications Ordered in UC Medications - No data to display  Initial Impression / Assessment and Plan / UC Course  I have reviewed the triage vital signs and the nursing notes.  Pertinent labs & imaging results that were available during my care of the patient were reviewed by me and considered in my medical decision making (see chart for details).     Cough and sore throat Final Clinical Impressions(s) / UC Diagnoses   Final diagnoses:  Acute cough  Upper respiratory tract infection, unspecified type  Acute cystitis with hematuria     Discharge  Instructions      Your  strep test was negative.  Your chest x-ray is pending.  You will get an official call if there are any positive results.  Preliminarily will we do not see any acute abnormalities.Your urine analysis did not indicate trace blood positive nitrates and bacteria.  Your urine will be sent for a urine culture.  You have been prescribed Augmentin 875/125 mg twice daily for 7 days.  The Augmentin is effective for treating pneumonia and urinary tract infection.  You have also been prescribed promethazine dextromethorphan 5 mL every 4 hours as needed for cough.     ED Prescriptions     Medication Sig Dispense Auth. Provider   promethazine-dextromethorphan (PROMETHAZINE-DM) 6.25-15 MG/5ML syrup Take 5 mLs by mouth 4 (four) times daily as needed for cough. 180 mL Barbette Merino, NP   amoxicillin-clavulanate (AUGMENTIN) 875-125 MG tablet Take 1 tablet by mouth every 12 (twelve) hours. 14 tablet Barbette Merino, NP      PDMP not reviewed this encounter.   Thad Ranger [place], [place] 06/13/23 [phone]

## 2023-06-13 NOTE — Discharge Instructions (Signed)
Your strep test was negative.  Your chest x-ray is pending.  You will get an official call if there are any positive results.  Preliminarily will we do not see any acute abnormalities.Your urine analysis did not indicate trace blood positive nitrates and bacteria.  Your urine will be sent for a urine culture.  You have been prescribed Augmentin 875/125 mg twice daily for 7 days.  The Augmentin is effective for treating pneumonia and urinary tract infection.  You have also been prescribed promethazine dextromethorphan 5 mL every 4 hours as needed for cough.

## 2023-06-15 ENCOUNTER — Telehealth: Payer: Self-pay

## 2023-06-15 LAB — URINE CULTURE: Culture: >=100000 — AB

## 2023-06-15 MED ORDER — NITROFURANTOIN MONOHYD MACRO 100 MG PO CAPS: 100.0000 mg | ORAL_CAPSULE | Freq: Two times a day (BID) | ORAL | 0 refills | Status: AC

## 2023-06-15 NOTE — Telephone Encounter (Signed)
Per protocol, pt to dc Augmentin and begin treatment with Macrobid.  Attempted to reach patient x1. LVM. Rx sent to pharmacy on file.
